# Patient Record
Sex: Female | Born: 1975 | Race: White | Hispanic: No | Marital: Married | Smoking: Never smoker
Health system: Southern US, Community
[De-identification: ages and names within clinical notes are randomized; demographics above are authoritative.]

## PROBLEM LIST (undated history)

## (undated) HISTORY — PX: NO PAST SURGERIES: SHX2092

---

## 2009-10-08 ENCOUNTER — Ambulatory Visit: Payer: Self-pay | Admitting: Internal Medicine

## 2011-07-27 ENCOUNTER — Emergency Department: Payer: Self-pay | Admitting: Emergency Medicine

## 2012-07-30 HISTORY — PX: WISDOM TOOTH EXTRACTION: SHX21

## 2016-11-05 ENCOUNTER — Ambulatory Visit
Admission: EM | Admit: 2016-11-05 | Discharge: 2016-11-05 | Disposition: A | Discharge status: Home / Self Care | Payer: BC Managed Care – PPO | Attending: Family Medicine | Admitting: Family Medicine

## 2016-11-05 DIAGNOSIS — T23172A Burn of first degree of left wrist, initial encounter: Secondary | ICD-10-CM | POA: Diagnosis not present

## 2016-11-05 DIAGNOSIS — T23171A Burn of first degree of right wrist, initial encounter: Secondary | ICD-10-CM

## 2016-11-05 MED ORDER — SILVER SULFADIAZINE 1 % EX CREA
TOPICAL_CREAM | Freq: Once | CUTANEOUS | Status: AC
  Administered 2016-11-05: 18:00:00 via TOPICAL

## 2016-11-05 MED ORDER — CYCLOBENZAPRINE HCL 10 MG PO TABS: 10.0000 mg | ORAL_TABLET | Freq: Every day | ORAL | 0 refills | Status: DC

## 2016-11-05 MED ORDER — HYDROCODONE-ACETAMINOPHEN 5-325 MG PO TABS: ORAL_TABLET | ORAL | 0 refills | Status: DC

## 2016-11-05 MED ORDER — SILVER SULFADIAZINE 1 % EX CREA: 1.0000 "application " | TOPICAL_CREAM | Freq: Two times a day (BID) | CUTANEOUS | 1 refills | Status: DC
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
## 2017-12-10 ENCOUNTER — Ambulatory Visit (INDEPENDENT_AMBULATORY_CARE_PROVIDER_SITE_OTHER): Payer: BC Managed Care – PPO | Admitting: Obstetrics and Gynecology

## 2017-12-10 ENCOUNTER — Ambulatory Visit
Admission: RE | Admit: 2017-12-10 | Discharge: 2017-12-10 | Disposition: A | Discharge status: Home / Self Care | Payer: BC Managed Care – PPO | Source: Ambulatory Visit | Attending: Obstetrics and Gynecology | Admitting: Obstetrics and Gynecology

## 2017-12-10 ENCOUNTER — Encounter: Payer: Self-pay | Admitting: Obstetrics and Gynecology

## 2017-12-10 VITALS — BP 140/80 | HR 78 | Ht 62.0 in | Wt 122.0 lb

## 2017-12-10 DIAGNOSIS — Z0000 Encounter for general adult medical examination without abnormal findings: Secondary | ICD-10-CM | POA: Diagnosis not present

## 2017-12-10 DIAGNOSIS — Z01419 Encounter for gynecological examination (general) (routine) without abnormal findings: Secondary | ICD-10-CM

## 2017-12-10 DIAGNOSIS — Z13220 Encounter for screening for lipoid disorders: Secondary | ICD-10-CM

## 2017-12-10 DIAGNOSIS — Z1231 Encounter for screening mammogram for malignant neoplasm of breast: Secondary | ICD-10-CM | POA: Insufficient documentation

## 2017-12-10 DIAGNOSIS — Z124 Encounter for screening for malignant neoplasm of cervix: Secondary | ICD-10-CM

## 2017-12-10 DIAGNOSIS — Z1151 Encounter for screening for human papillomavirus (HPV): Secondary | ICD-10-CM | POA: Diagnosis not present

## 2017-12-10 DIAGNOSIS — Z1239 Encounter for other screening for malignant neoplasm of breast: Secondary | ICD-10-CM

## 2017-12-10 DIAGNOSIS — N912 Amenorrhea, unspecified: Secondary | ICD-10-CM

## 2017-12-10 LAB — POCT URINE PREGNANCY: PREG TEST UR: NEGATIVE

## 2017-12-10 NOTE — Progress Notes (Signed)
PCP:  Patient, No Pcp Per   Chief Complaint  Patient presents with  . Gynecologic Exam     no period     HPI:      Ms. Carmen Trujillo is a 42 y.o. G2P2002 who LMP was Patient's last menstrual period was 11/05/2017., presents today for her NP annual examination.  Her menses are regular every 28-30 days, lasting 5 days.  Dysmenorrhea none. She does not have intermenstrual bleeding. She has not had a period this month and this has never happened to her before. She had a neg UPT at home. Pt is concerned. No pelvic pain, wt change. Was sick with cough last month.   Sex activity: single partner, contraception - none.  Last Pap: no recent paps, no hx of abn paps Hx of STDs: none  Last mammogram: not recent There is no FH of breast cancer. There is no FH of ovarian cancer. The patient does do self-breast exams.  Tobacco use: The patient denies current or previous tobacco use. Alcohol use: social drinker No drug use.  Exercise: not active  She does get adequate calcium and Vitamin D in her diet. No recent labs.   History reviewed. No pertinent past medical history.  Past Surgical History:  Procedure Laterality Date  . NO PAST SURGERIES    . WISDOM TOOTH EXTRACTION  2014   one    History reviewed. No pertinent family history.  Social History   Socioeconomic History  . Marital status: Married    Spouse name: Not on file  . Number of children: Not on file  . Years of education: Not on file  . Highest education level: Not on file  Occupational History  . Not on file  Social Needs  . Financial resource strain: Not on file  . Food insecurity:    Worry: Not on file    Inability: Not on file  . Transportation needs:    Medical: Not on file    Non-medical: Not on file  Tobacco Use  . Smoking status: Never Smoker  . Smokeless tobacco: Never Used  Substance and Sexual Activity  . Alcohol use: Yes    Comment: once a week  . Drug use: No  . Sexual activity: Yes   Birth control/protection: None, Condom  Lifestyle  . Physical activity:    Days per week: Not on file    Minutes per session: Not on file  . Stress: Not on file  Relationships  . Social connections:    Talks on phone: Not on file    Gets together: Not on file    Attends religious service: Not on file    Active member of club or organization: Not on file    Attends meetings of clubs or organizations: Not on file    Relationship status: Not on file  . Intimate partner violence:    Fear of current or ex partner: Not on file    Emotionally abused: Not on file    Physically abused: Not on file    Forced sexual activity: Not on file  Other Topics Concern  . Not on file  Social History Narrative  . Not on file    Outpatient Medications Prior to Visit  Medication Sig Dispense Refill  . Multiple Vitamins-Minerals (WOMENS HAIR, SKIN & NAILS PO) Take 1 tablet by mouth daily.    . cyclobenzaprine (FLEXERIL) 10 MG tablet Take 1 tablet (10 mg total) by mouth at bedtime. prn 30 tablet 0  .  HYDROcodone-acetaminophen (NORCO/VICODIN) 5-325 MG tablet 1-2 tabs po bid prn 10 tablet 0  . silver sulfADIAZINE (SILVADENE) 1 % cream Apply 1 application topically 2 (two) times daily. 25 g 1   No facility-administered medications prior to visit.     ROS:  Review of Systems  Constitutional: Negative for fatigue, fever and unexpected weight change.  Respiratory: Negative for cough, shortness of breath and wheezing.   Cardiovascular: Negative for chest pain, palpitations and leg swelling.  Gastrointestinal: Negative for blood in stool, constipation, diarrhea, nausea and vomiting.  Endocrine: Negative for cold intolerance, heat intolerance and polyuria.  Genitourinary: Positive for menstrual problem. Negative for dyspareunia, dysuria, flank pain, frequency, genital sores, hematuria, pelvic pain, urgency, vaginal bleeding, vaginal discharge and vaginal pain.  Musculoskeletal: Negative for back pain, joint  swelling and myalgias.  Skin: Negative for rash.  Neurological: Negative for dizziness, syncope, light-headedness, numbness and headaches.  Hematological: Negative for adenopathy.  Psychiatric/Behavioral: Negative for agitation, confusion, sleep disturbance and suicidal ideas. The patient is not nervous/anxious.    BREAST: No symptoms   Objective: BP 140/80   Pulse 78   Ht  (1.575 m)   Wt 122 lb (55.3 kg)   LMP 11/05/2017   BMI 22.31 kg/m    Physical Exam  Constitutional: She is oriented to person, place, and time. She appears well-developed and well-nourished.  Genitourinary: Vagina normal and uterus normal. There is no rash or tenderness on the right labia. There is no rash or tenderness on the left labia. No erythema or tenderness in the vagina. No vaginal discharge found. Right adnexum does not display mass and does not display tenderness. Left adnexum does not display mass and does not display tenderness. Cervix does not exhibit motion tenderness or polyp. Uterus is not enlarged or tender.  Neck: Normal range of motion. No thyromegaly present.  Cardiovascular: Normal rate, regular rhythm and normal heart sounds.  No murmur heard. Pulmonary/Chest: Effort normal and breath sounds normal. Right breast exhibits no mass, no nipple discharge, no skin change and no tenderness. Left breast exhibits no mass, no nipple discharge, no skin change and no tenderness.  Abdominal: Soft. There is no tenderness. There is no guarding.  Musculoskeletal: Normal range of motion.  Neurological: She is alert and oriented to person, place, and time. No cranial nerve deficit.  Psychiatric: She has a normal mood and affect. Her behavior is normal.  Vitals reviewed.   Results: Results for orders placed or performed in visit on 12/10/17 (from the past 24 hour(s))  POCT urine pregnancy     Status: Normal   Collection Time: 12/10/17 10:16 AM  Result Value Ref Range   Preg Test, Ur Negative Negative      Assessment/Plan: Encounter for annual routine gynecological examination  Cervical cancer screening - Plan: IGP, Aptima HPV  Screening for HPV (human papillomavirus) - Plan: IGP, Aptima HPV  Screening for breast cancer - Pt to sched mammo - Plan: MM DIGITAL SCREENING BILATERAL  Blood tests for routine general physical examination - Plan: Comprehensive metabolic panel, Lipid panel  Screening cholesterol level - Plan: Lipid panel  Absence of menstruation - Neg UPT. See if menses return to normal next mo. If not, pt to call me so I can add thyroid/prolactin to labs. Discussed perimenopause. Reassurance.  - Plan: POCT urine pregnancy           GYN counsel breast self exam, mammography screening, menopause, adequate intake of calcium and vitamin D, diet and exercise  F/U  Return in about 1 year (around 12/11/2018).  Alicia B. Copland, PA-C 12/10/2017 10:53 AM

## 2017-12-10 NOTE — Patient Instructions (Addendum)
I value your feedback and entrusting us with your care. If you get a [facility] patient survey, I would appreciate you taking the time to let us know about your experience today. Thank you!  [facility]: [phone]    

## 2017-12-11 ENCOUNTER — Other Ambulatory Visit: Payer: Self-pay | Admitting: Obstetrics and Gynecology

## 2017-12-11 DIAGNOSIS — R928 Other abnormal and inconclusive findings on diagnostic imaging of breast: Secondary | ICD-10-CM

## 2017-12-11 DIAGNOSIS — N6310 Unspecified lump in the right breast, unspecified quadrant: Secondary | ICD-10-CM

## 2017-12-13 LAB — IGP, APTIMA HPV
HPV Aptima: NEGATIVE
PAP Smear Comment: 0

## 2017-12-20 ENCOUNTER — Ambulatory Visit
Admission: RE | Admit: 2017-12-20 | Discharge: 2017-12-20 | Disposition: A | Discharge status: Home / Self Care | Payer: BC Managed Care – PPO | Source: Ambulatory Visit | Attending: Obstetrics and Gynecology | Admitting: Obstetrics and Gynecology

## 2017-12-20 ENCOUNTER — Other Ambulatory Visit: Payer: Self-pay | Admitting: Obstetrics and Gynecology

## 2017-12-20 ENCOUNTER — Encounter: Payer: Self-pay | Admitting: Obstetrics and Gynecology

## 2017-12-20 DIAGNOSIS — N6310 Unspecified lump in the right breast, unspecified quadrant: Secondary | ICD-10-CM

## 2017-12-20 DIAGNOSIS — R928 Other abnormal and inconclusive findings on diagnostic imaging of breast: Secondary | ICD-10-CM

## 2018-06-23 IMAGING — US US BREAST*R* LIMITED INC AXILLA
1 series · 12 of 12 positions shown · non-contrast
Comparison: Previous exam(s).

CLINICAL DATA: Bilateral focal asymmetry seen on baseline screening
mammography.

EXAM:
DIGITAL DIAGNOSTIC BILATERAL MAMMOGRAM WITH CAD AND TOMO
ULTRASOUND BILATERAL BREAST

[Series 1: us breast*right* limited inc axilla · 0.06mm/px · 12 of 12 slices shown]
[im 1/12]
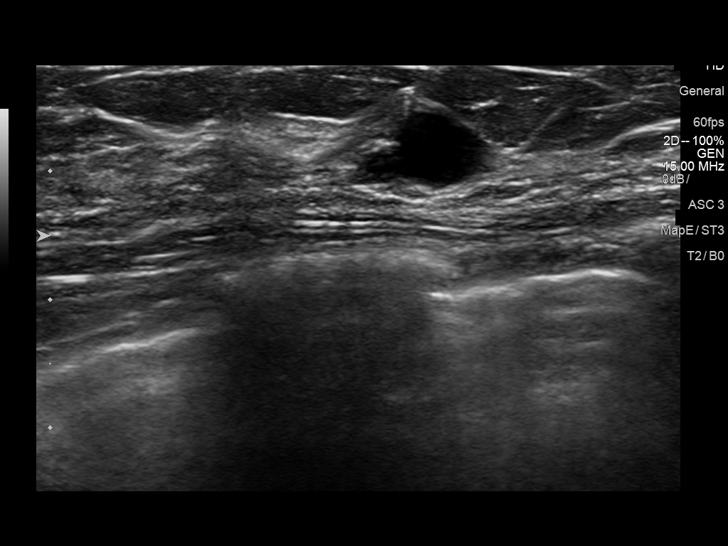
[im 2/12]
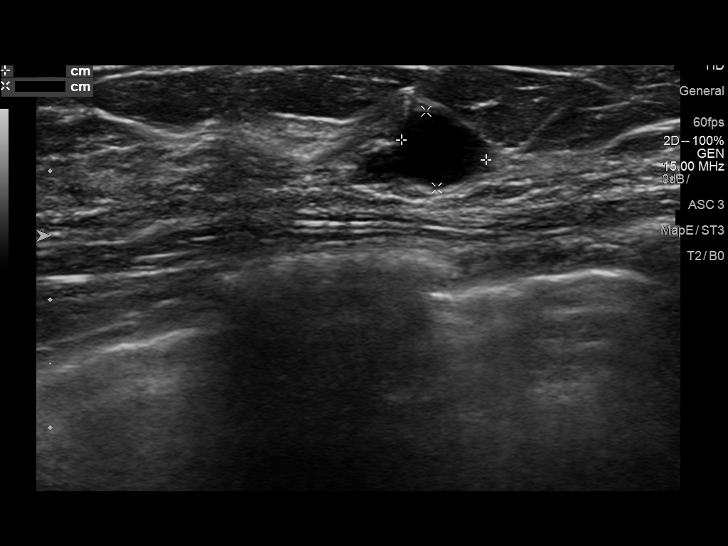
[im 3/12]
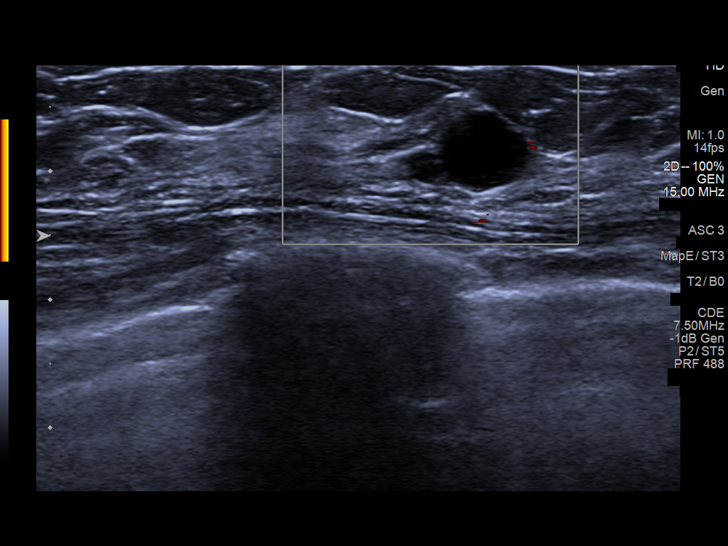
[im 4/12]
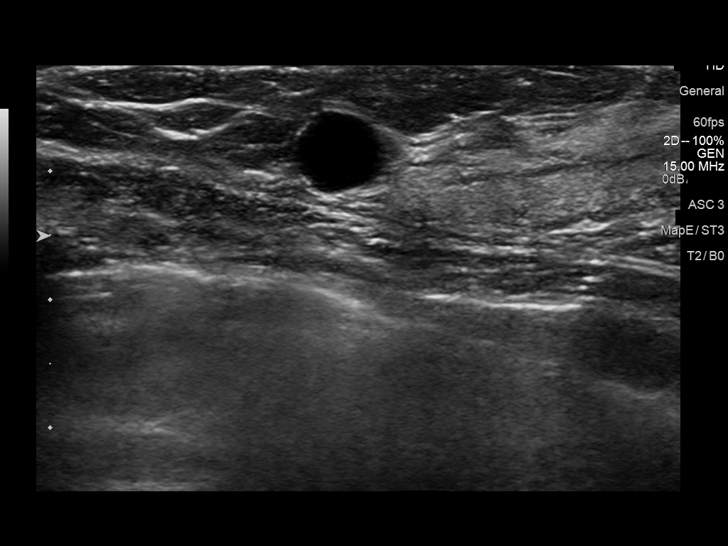
[im 5/12]
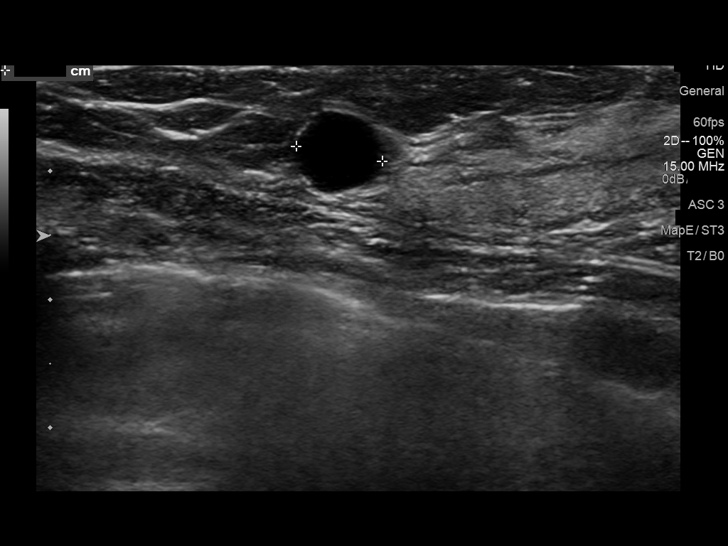
[im 6/12]
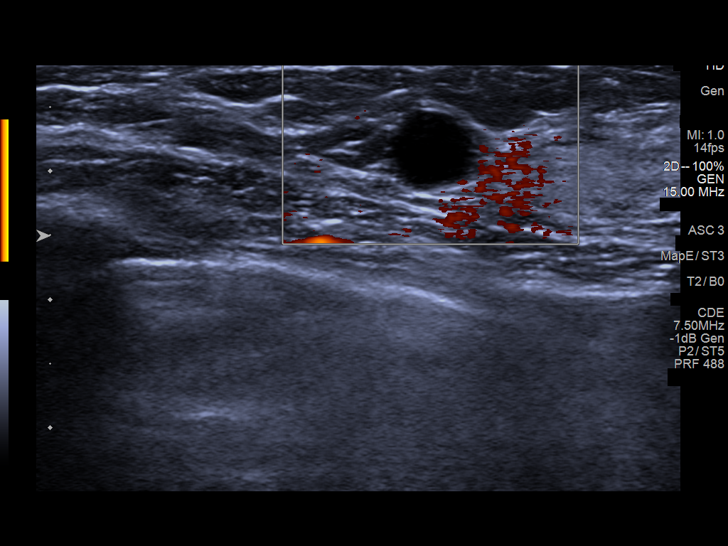
[im 7/12]
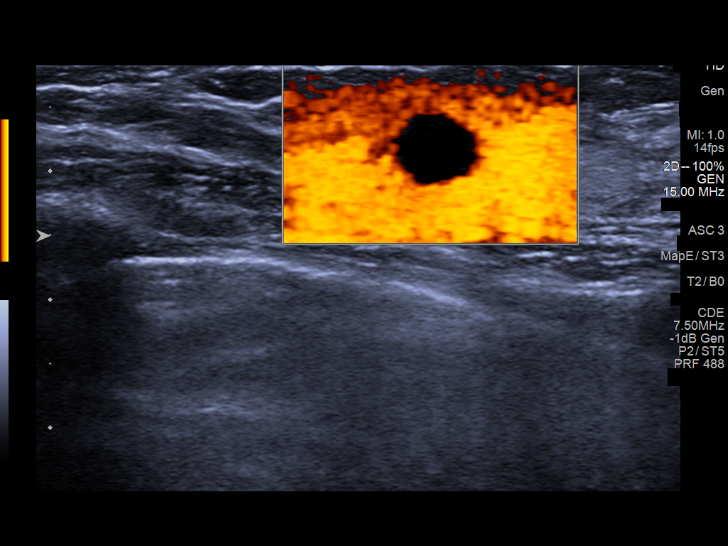
[im 8/12]
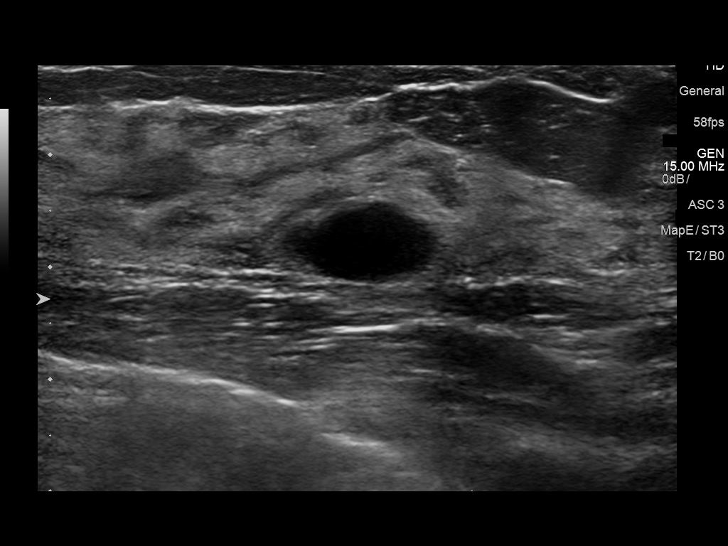
[im 9/12]
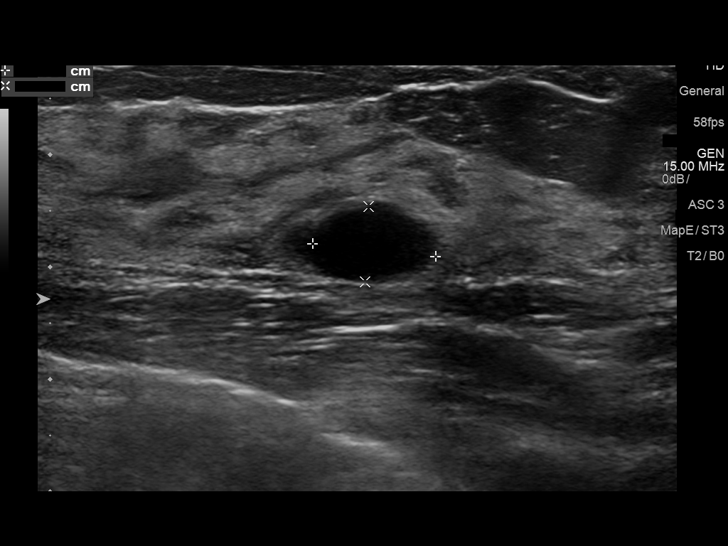
[im 10/12]
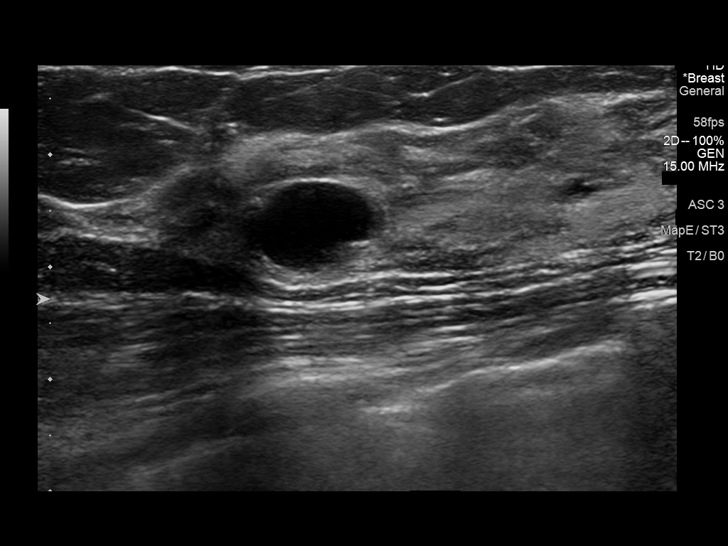
[im 11/12]
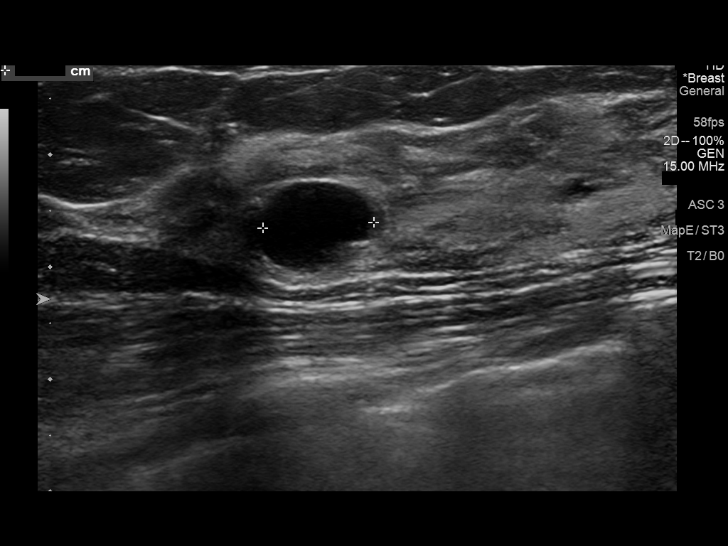
[im 12/12]
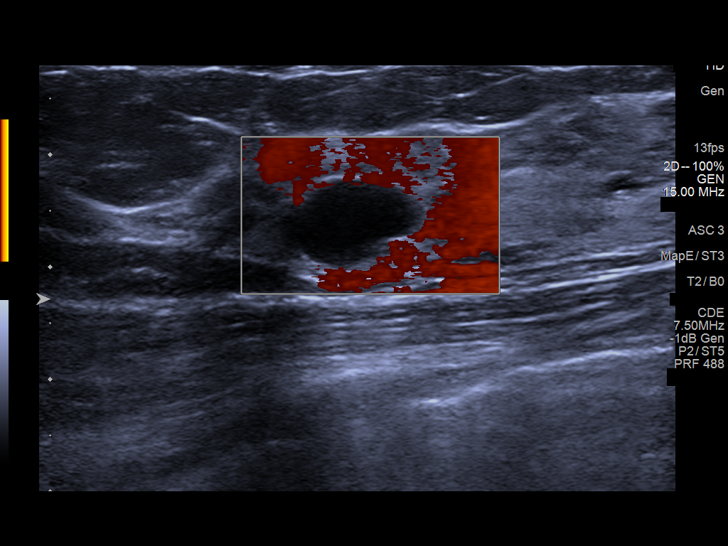

[12 of 12 positions shown; findings below may reference images not displayed]

ACR Breast Density Category c: The breast tissue is heterogeneously
dense, which may obscure small masses.
FINDINGS: Mammographically, there is a persistent circumscribed 7 mm
benign-appearing nodule in the right 9 o'clock breast, posterior
depth. The previously seen focal asymmetries seen in the left breast
upper outer quadrant effaced to glandular tissue on additional
imaging. Scattered benign-appearing calcifications are seen in the
left breast upper inner quadrant, middle and posterior depth.

Mammographic images were processed with CAD.

On physical exam, no suspicious masses are palpated.

Targeted right breast ultrasound is performed, showing
benign-appearing cysts in the right breast upper outer quadrant. A
right breast 9 o'clock 4 cm from the nipple cyst measuring 7 x 7 x 7
mm likely corresponds to the mammographically seen nodule. There is
a second right breast 11 o'clock 1 cm from the nipple 1.1 cm
benign-appearing cyst.

Targeted left breast ultrasound is performed, showing
benign-appearing cyst in the left breast 1 o'clock 2 cm from the
nipple measuring 1.1 cm.
IMPRESSION: Bilateral benign-appearing cysts.

No mammographic or sonographic evidence of malignancy in either
breast.

RECOMMENDATION:
Screening mammogram in one year.(Code:FX-U-OG3)

I have discussed the findings and recommendations with the patient.
Results were also provided in writing at the conclusion of the
visit. If applicable, a reminder letter will be sent to the patient
regarding the next appointment.

BI-RADS CATEGORY  2: Benign.

## 2021-09-18 ENCOUNTER — Other Ambulatory Visit: Payer: Self-pay | Admitting: Student

## 2021-09-18 DIAGNOSIS — Z1231 Encounter for screening mammogram for malignant neoplasm of breast: Secondary | ICD-10-CM

## 2021-10-25 ENCOUNTER — Ambulatory Visit
Admission: RE | Admit: 2021-10-25 | Discharge: 2021-10-25 | Disposition: A | Discharge status: Home / Self Care | Payer: BC Managed Care – PPO | Source: Ambulatory Visit | Attending: Student | Admitting: Student

## 2021-10-25 DIAGNOSIS — Z1231 Encounter for screening mammogram for malignant neoplasm of breast: Secondary | ICD-10-CM | POA: Insufficient documentation

## 2022-09-17 ENCOUNTER — Other Ambulatory Visit: Payer: Self-pay | Admitting: Student

## 2022-09-17 DIAGNOSIS — Z1231 Encounter for screening mammogram for malignant neoplasm of breast: Secondary | ICD-10-CM

## 2022-10-29 ENCOUNTER — Ambulatory Visit
Admission: RE | Admit: 2022-10-29 | Discharge: 2022-10-29 | Disposition: A | Discharge status: Home / Self Care | Payer: BC Managed Care – PPO | Source: Ambulatory Visit | Attending: Student | Admitting: Student

## 2022-10-29 DIAGNOSIS — Z1231 Encounter for screening mammogram for malignant neoplasm of breast: Secondary | ICD-10-CM | POA: Diagnosis present

## 2022-10-31 ENCOUNTER — Encounter: Payer: Self-pay | Admitting: Student

## 2022-11-05 ENCOUNTER — Other Ambulatory Visit: Payer: Self-pay | Admitting: Student

## 2022-11-05 DIAGNOSIS — R928 Other abnormal and inconclusive findings on diagnostic imaging of breast: Secondary | ICD-10-CM

## 2022-11-05 DIAGNOSIS — N6489 Other specified disorders of breast: Secondary | ICD-10-CM

## 2022-11-09 ENCOUNTER — Other Ambulatory Visit: Payer: Self-pay | Admitting: Student

## 2022-11-09 ENCOUNTER — Ambulatory Visit
Admission: RE | Admit: 2022-11-09 | Discharge: 2022-11-09 | Disposition: A | Discharge status: Home / Self Care | Payer: BC Managed Care – PPO | Source: Ambulatory Visit | Attending: Student | Admitting: Student

## 2022-11-09 DIAGNOSIS — N63 Unspecified lump in unspecified breast: Secondary | ICD-10-CM

## 2022-11-09 DIAGNOSIS — N6489 Other specified disorders of breast: Secondary | ICD-10-CM | POA: Diagnosis present

## 2022-11-09 DIAGNOSIS — R928 Other abnormal and inconclusive findings on diagnostic imaging of breast: Secondary | ICD-10-CM

## 2022-11-21 ENCOUNTER — Ambulatory Visit
Admission: RE | Admit: 2022-11-21 | Discharge: 2022-11-21 | Disposition: A | Discharge status: Home / Self Care | Payer: BC Managed Care – PPO | Source: Ambulatory Visit | Attending: Student | Admitting: Student

## 2022-11-21 DIAGNOSIS — R928 Other abnormal and inconclusive findings on diagnostic imaging of breast: Secondary | ICD-10-CM | POA: Diagnosis present

## 2022-11-21 DIAGNOSIS — N6001 Solitary cyst of right breast: Secondary | ICD-10-CM | POA: Diagnosis not present

## 2022-11-21 DIAGNOSIS — N63 Unspecified lump in unspecified breast: Secondary | ICD-10-CM | POA: Insufficient documentation

## 2022-11-21 MED ORDER — LIDOCAINE HCL (PF) 1 % IJ SOLN
10.0000 mL | Freq: Once | INTRAMUSCULAR | Status: AC
  Administered 2022-11-21: 10 mL via INTRADERMAL
  Filled 2022-11-21: qty 10

## 2023-01-03 IMAGING — MG MM DIGITAL SCREENING BILAT W/ TOMO AND CAD
8 series · 8 of 24 positions shown · non-contrast
Comparison: Previous exam(s).

CLINICAL DATA: Screening.

EXAM:
DIGITAL SCREENING BILATERAL MAMMOGRAM WITH TOMOSYNTHESIS AND CAD
TECHNIQUE: Bilateral screening digital craniocaudal and mediolateral oblique
mammograms were obtained. Bilateral screening digital breast
tomosynthesis was performed. The images were evaluated with
computer-aided detection.

[R MLO synth-2D]
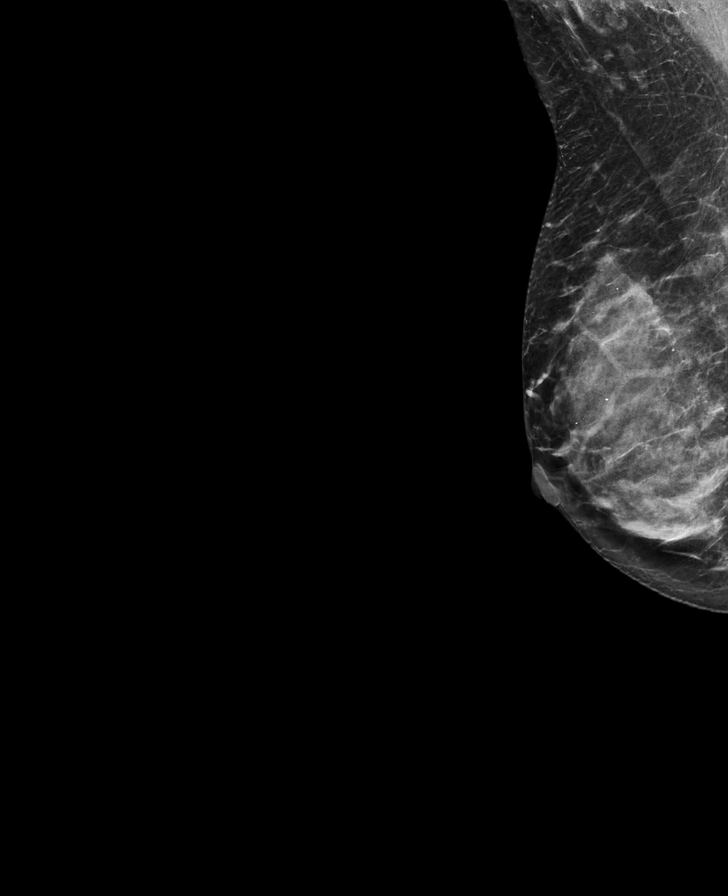

[R CC synth-2D]
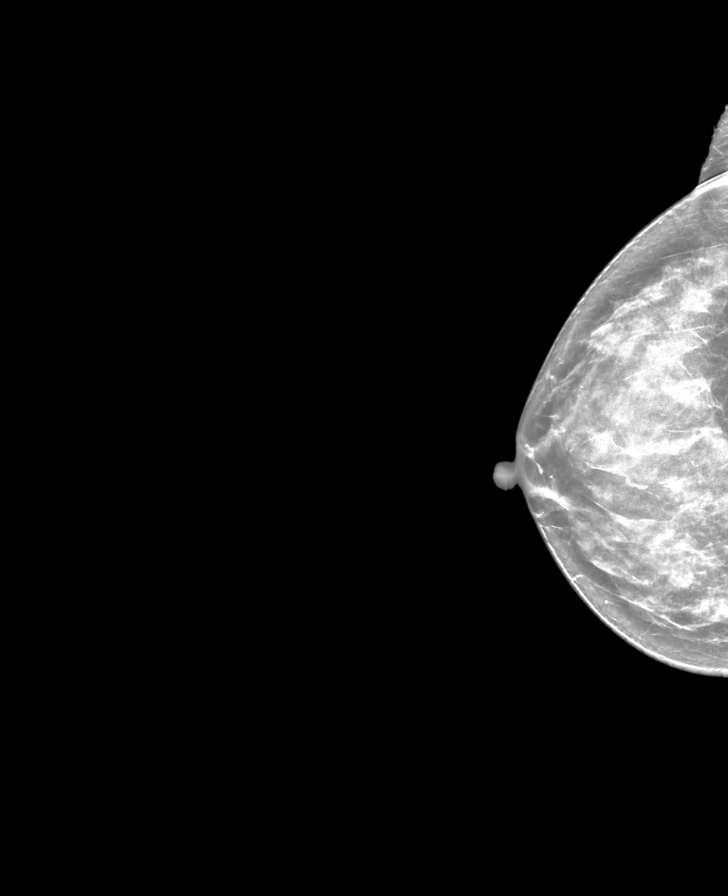

[L CC synth-2D]
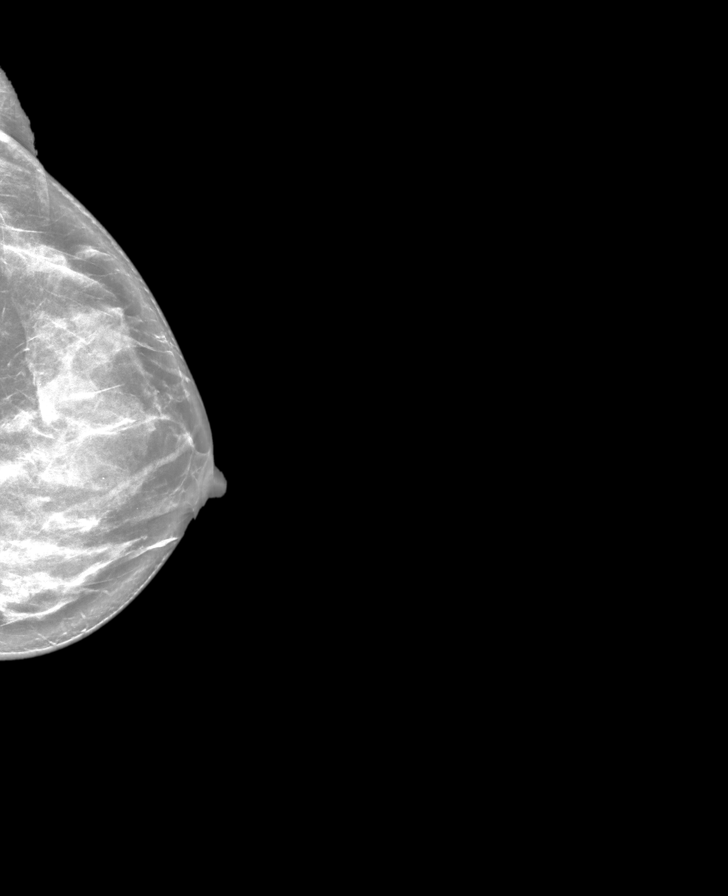

[L MLO synth-2D]
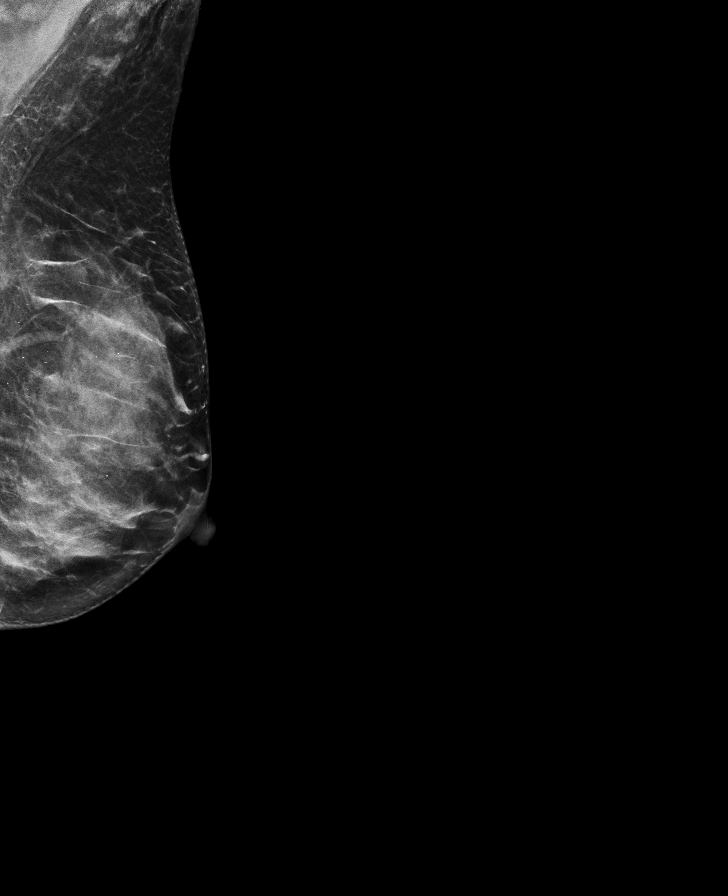

[L CC tomo · tomo slice 36/71.0]
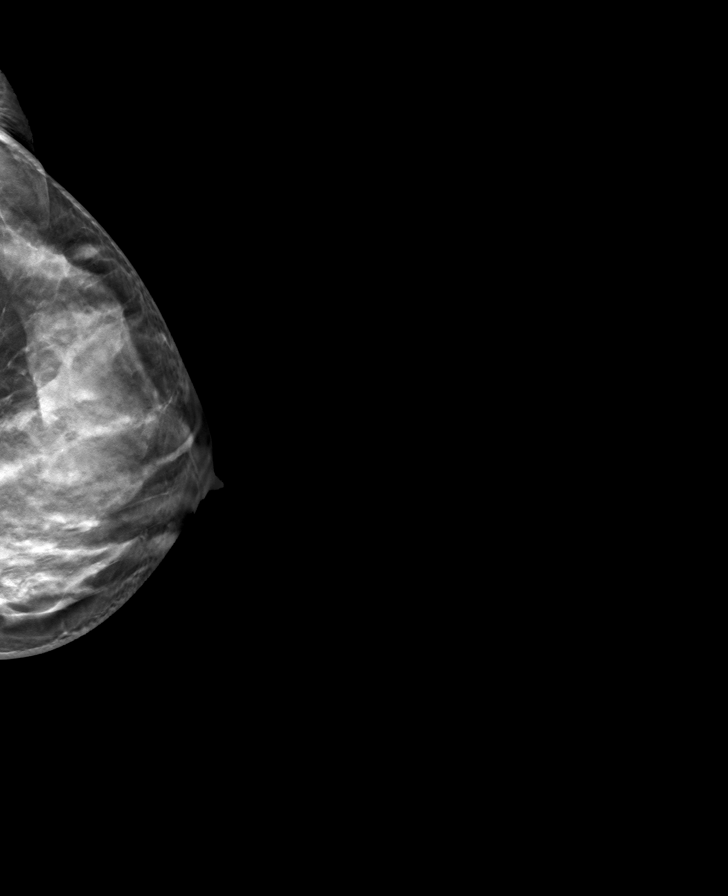

[R MLO tomo · tomo slice 32/63.0]
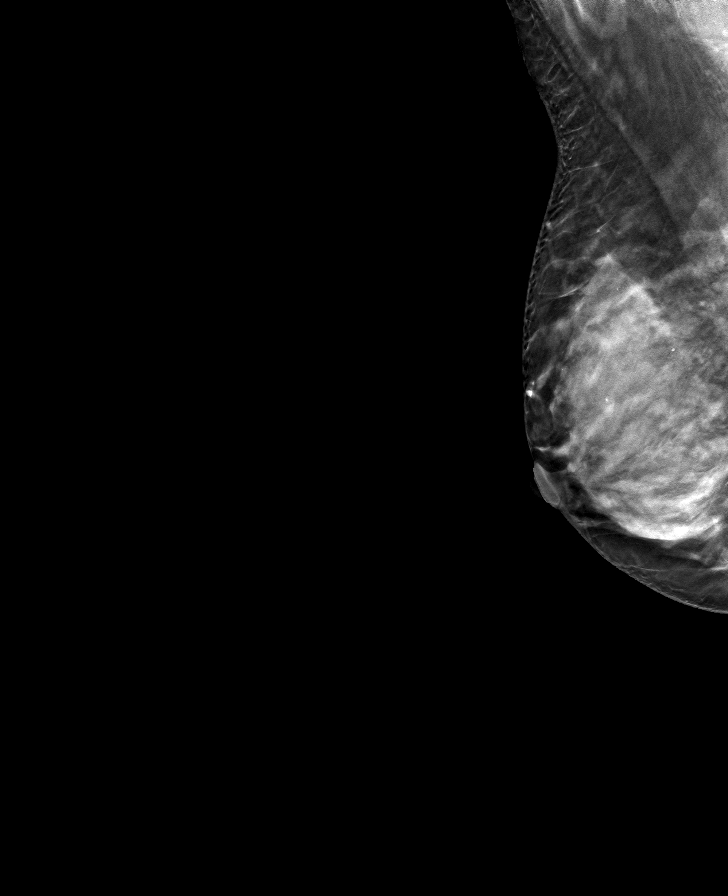

[R CC tomo · tomo slice 32/63.0]
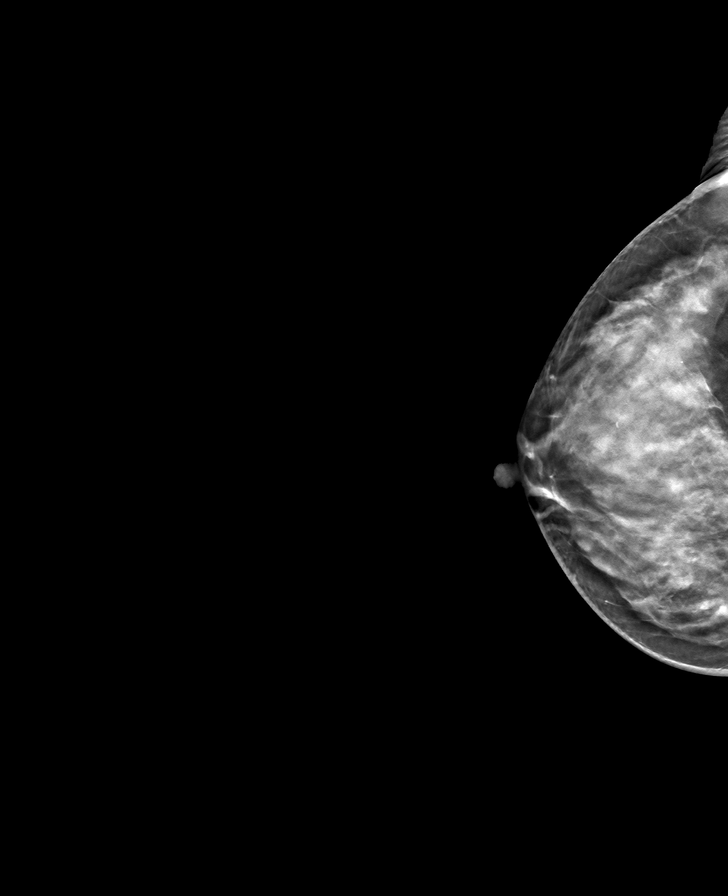

[L MLO tomo · tomo slice 33/65.0]
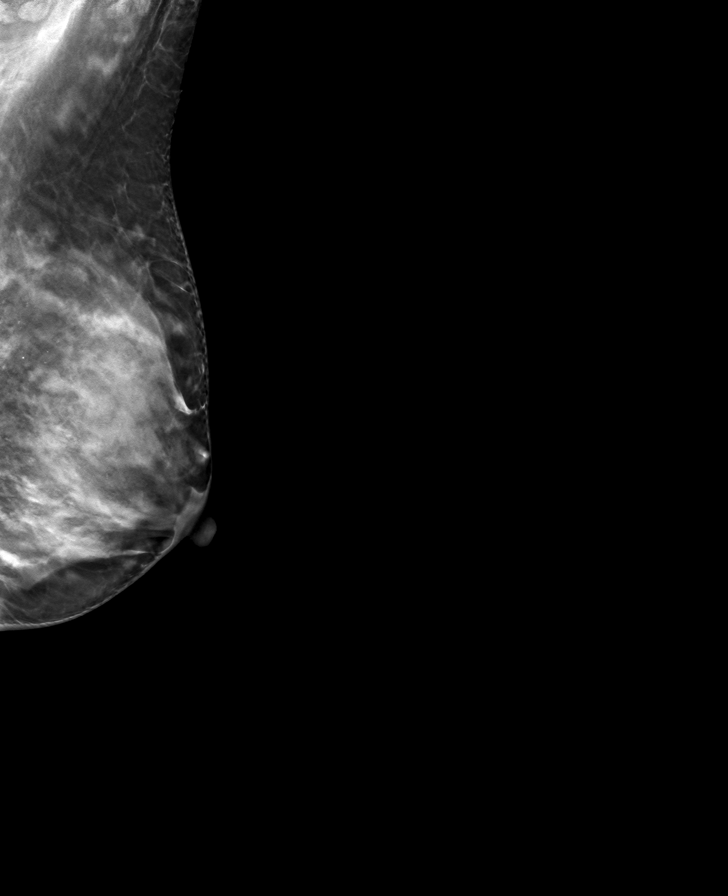

[8 of 24 positions shown; findings below may reference images not displayed]

ACR Breast Density Category c: The breast tissue is heterogeneously
dense, which may obscure small masses.
FINDINGS: There are no findings suspicious for malignancy.
IMPRESSION: No mammographic evidence of malignancy. A result letter of this
screening mammogram will be mailed directly to the patient.

RECOMMENDATION:
Screening mammogram in one year. (Code:Q3-W-BC3)

BI-RADS CATEGORY  1: Negative.

## 2023-10-10 ENCOUNTER — Other Ambulatory Visit: Payer: Self-pay | Admitting: Student

## 2023-10-10 DIAGNOSIS — Z1231 Encounter for screening mammogram for malignant neoplasm of breast: Secondary | ICD-10-CM

## 2023-10-30 ENCOUNTER — Ambulatory Visit
Admission: RE | Admit: 2023-10-30 | Discharge: 2023-10-30 | Disposition: A | Discharge status: Home / Self Care | Payer: Self-pay | Source: Ambulatory Visit | Attending: Student | Admitting: Student

## 2023-10-30 DIAGNOSIS — Z1231 Encounter for screening mammogram for malignant neoplasm of breast: Secondary | ICD-10-CM | POA: Diagnosis present

## 2024-08-12 ENCOUNTER — Other Ambulatory Visit: Payer: Self-pay | Admitting: Student

## 2024-08-12 DIAGNOSIS — Z1231 Encounter for screening mammogram for malignant neoplasm of breast: Secondary | ICD-10-CM

## 2024-11-02 ENCOUNTER — Ambulatory Visit
